# Patient Record
Sex: Male | Born: 1985 | Race: White | Hispanic: No | Marital: Married | Smoking: Current every day smoker
Health system: Southern US, Community
[De-identification: ages and names within clinical notes are randomized; demographics above are authoritative.]

## PROBLEM LIST (undated history)

## (undated) DIAGNOSIS — I10 Essential (primary) hypertension: Secondary | ICD-10-CM

## (undated) DIAGNOSIS — G4730 Sleep apnea, unspecified: Secondary | ICD-10-CM

---

## 2020-10-26 ENCOUNTER — Encounter (HOSPITAL_COMMUNITY): Payer: Self-pay | Admitting: Emergency Medicine

## 2020-10-26 ENCOUNTER — Emergency Department (HOSPITAL_COMMUNITY): Payer: Self-pay

## 2020-10-26 ENCOUNTER — Emergency Department (HOSPITAL_COMMUNITY)
Admission: EM | Admit: 2020-10-26 | Discharge: 2020-10-27 | Disposition: A | Discharge status: Home / Self Care | Payer: Self-pay | Attending: Emergency Medicine | Admitting: Emergency Medicine

## 2020-10-26 ENCOUNTER — Other Ambulatory Visit: Payer: Self-pay

## 2020-10-26 DIAGNOSIS — R0602 Shortness of breath: Secondary | ICD-10-CM | POA: Insufficient documentation

## 2020-10-26 DIAGNOSIS — J029 Acute pharyngitis, unspecified: Secondary | ICD-10-CM | POA: Insufficient documentation

## 2020-10-26 DIAGNOSIS — R519 Headache, unspecified: Secondary | ICD-10-CM | POA: Insufficient documentation

## 2020-10-26 DIAGNOSIS — F17210 Nicotine dependence, cigarettes, uncomplicated: Secondary | ICD-10-CM | POA: Insufficient documentation

## 2020-10-26 DIAGNOSIS — I10 Essential (primary) hypertension: Secondary | ICD-10-CM | POA: Insufficient documentation

## 2020-10-26 HISTORY — DX: Sleep apnea, unspecified: G47.30

## 2020-10-26 HISTORY — DX: Essential (primary) hypertension: I10
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
## 2020-10-26 NOTE — ED Notes (Addendum)
Called poison control for patient being in a room with an ozone machine. Ekg and chest xray. Make sure O2 is maintained.

## 2020-10-26 NOTE — ED Triage Notes (Signed)
Patient complaining of sob from being in a room with an ozone machine. Patient states he feel dizzy and sob.

## 2020-10-27 MED ORDER — ALBUTEROL SULFATE HFA 108 (90 BASE) MCG/ACT IN AERS
2.0000 | INHALATION_SPRAY | RESPIRATORY_TRACT | Status: DC | PRN
  Administered 2020-10-27: 2 via RESPIRATORY_TRACT
  Filled 2020-10-27: qty 6.7

## 2020-10-27 NOTE — Discharge Instructions (Addendum)
Use the inhaler every 4 hours for any shortness of breath.   Return to the ED with any worsening symptoms or new concerns.

## 2020-10-27 NOTE — ED Notes (Signed)
Pt verbalized understanding of d/c, medication, and follow up care. Ambulatory with steady gait. Pt went home with albuterol inhaler.
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
## 2022-11-12 IMAGING — CR DG CHEST 2V
2 series · 2 of 2 positions shown · non-contrast
Comparison: None.

CLINICAL DATA: 0 zone exposure short of breath

EXAM:
CHEST - 2 VIEW

[w chest pa]
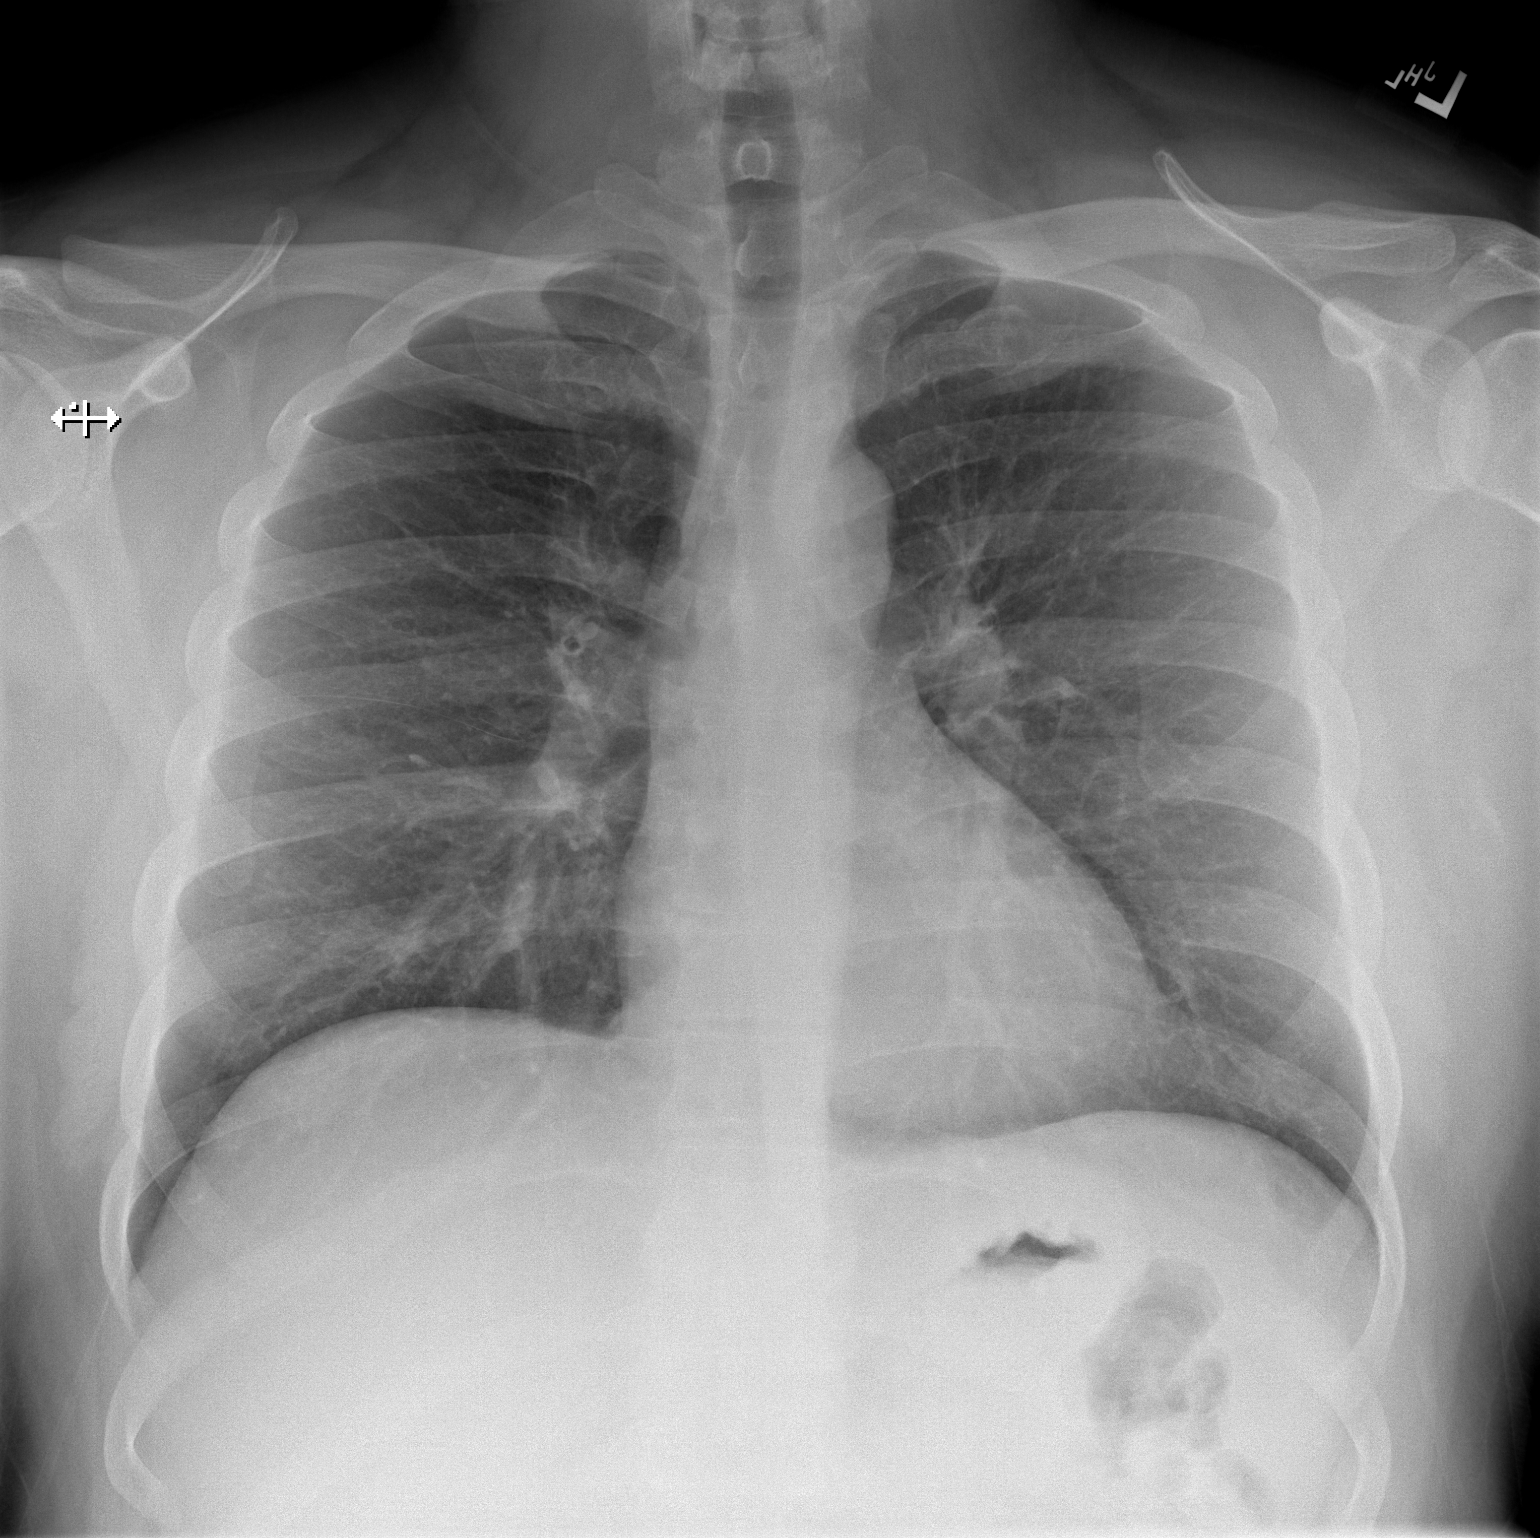

[w chest lat]
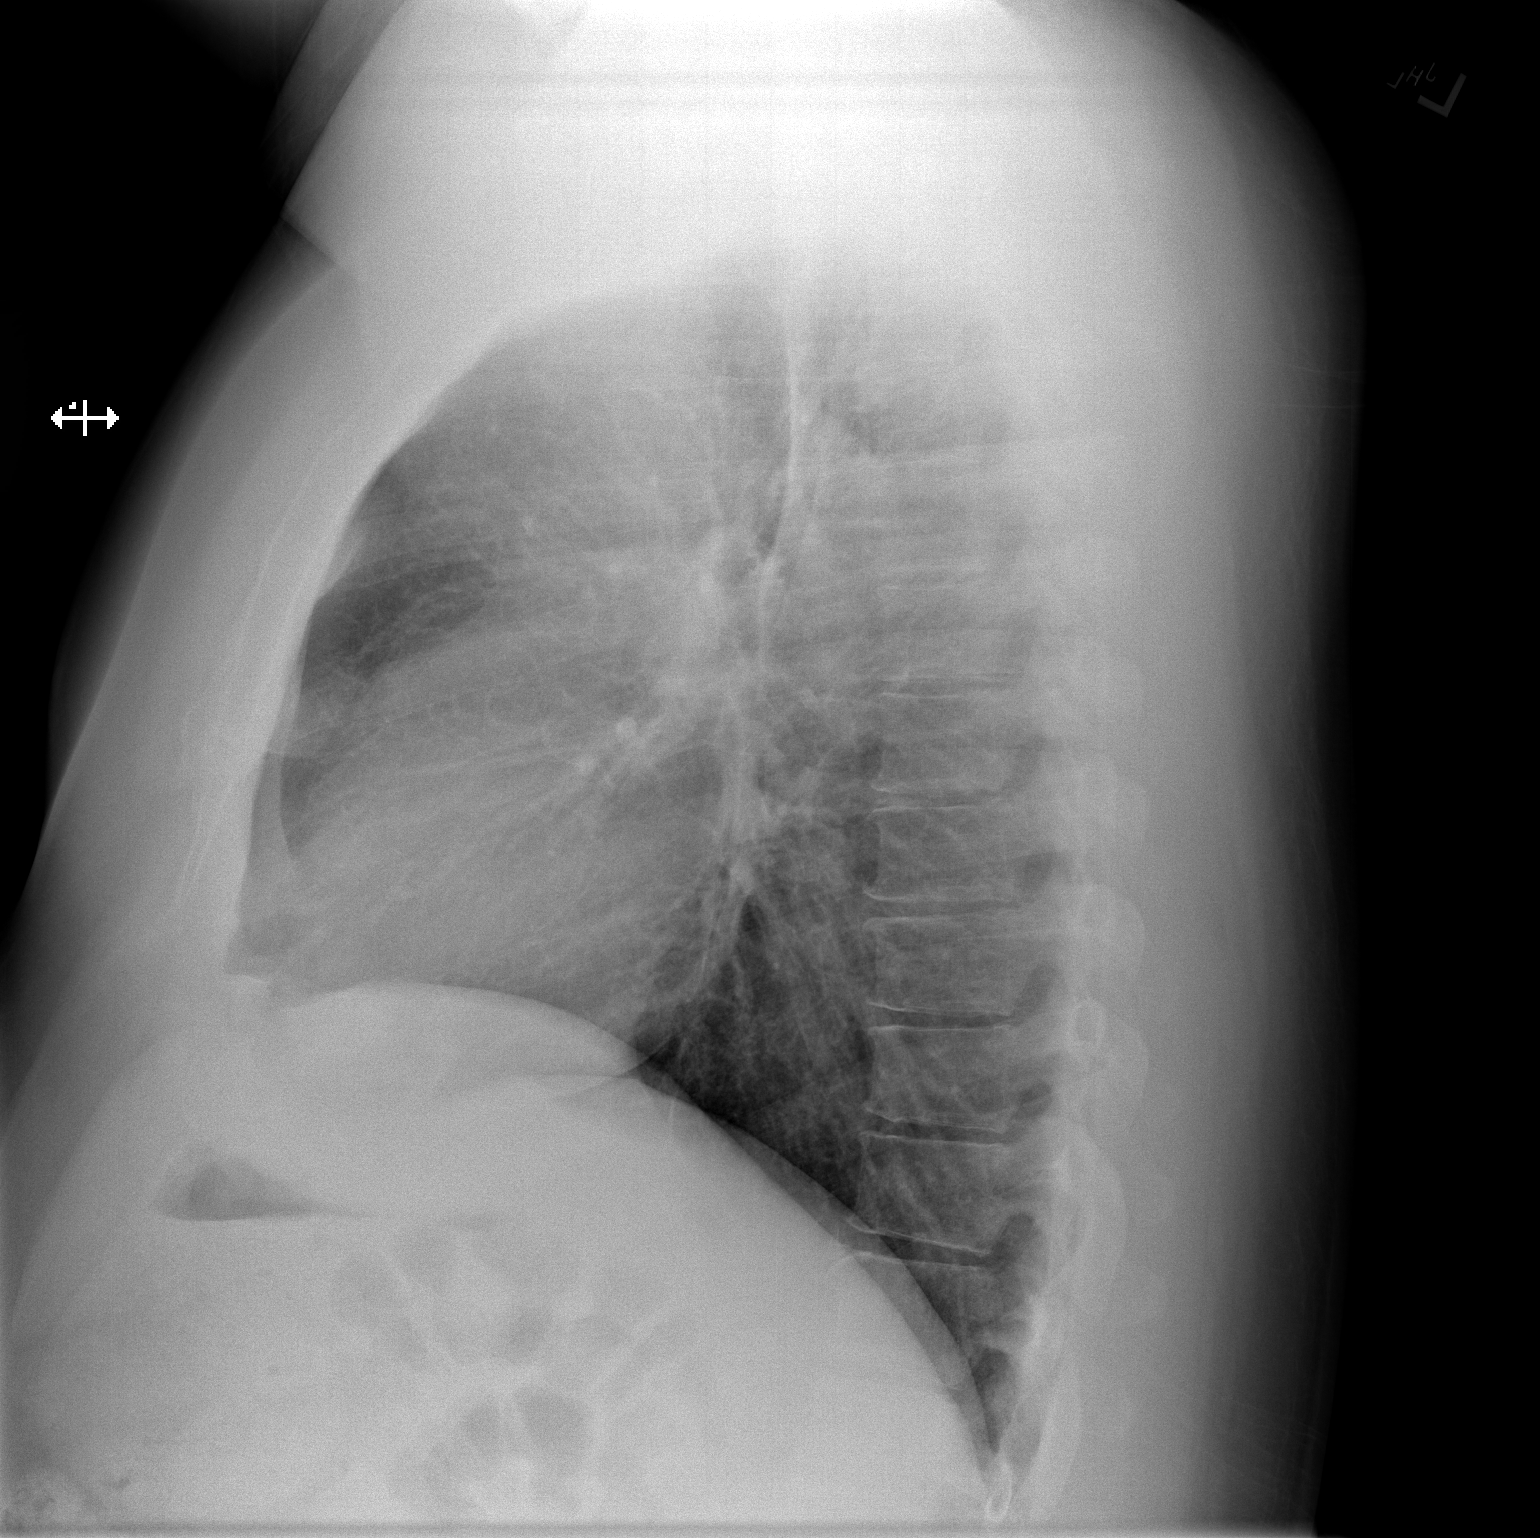

[2 of 2 positions shown; findings below may reference images not displayed]

FINDINGS: The heart size and mediastinal contours are within normal limits.
Both lungs are clear. The visualized skeletal structures are
unremarkable.
IMPRESSION: No active cardiopulmonary disease.
